# Patient Record
Sex: Male | Born: 2005 | Race: Black or African American | Hispanic: No | Marital: Single | Smoking: Never smoker
Health system: Southern US, Community
[De-identification: ages and names within clinical notes are randomized; demographics above are authoritative.]

## PROBLEM LIST (undated history)

## (undated) DIAGNOSIS — F419 Anxiety disorder, unspecified: Secondary | ICD-10-CM

## (undated) DIAGNOSIS — F319 Bipolar disorder, unspecified: Secondary | ICD-10-CM

---
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
## 2023-09-21 ENCOUNTER — Emergency Department
Admission: EM | Admit: 2023-09-21 | Discharge: 2023-09-21 | Disposition: A | Discharge status: Home / Self Care | Attending: Emergency Medicine | Admitting: Emergency Medicine

## 2023-09-21 ENCOUNTER — Other Ambulatory Visit: Payer: Self-pay

## 2023-09-21 DIAGNOSIS — R42 Dizziness and giddiness: Secondary | ICD-10-CM | POA: Diagnosis present

## 2023-09-21 HISTORY — DX: Anxiety disorder, unspecified: F41.9

## 2023-09-21 HISTORY — DX: Bipolar disorder, unspecified: F31.9

## 2023-09-21 LAB — BASIC METABOLIC PANEL WITH GFR
Anion gap: 8 (ref 5–15)
BUN: 13 mg/dL (ref 6–20)
CO2: 26 mmol/L (ref 22–32)
Calcium: 9.2 mg/dL (ref 8.9–10.3)
Chloride: 104 mmol/L (ref 98–111)
Creatinine, Ser: 0.87 mg/dL (ref 0.61–1.24)
GFR, Estimated: >60 mL/min (ref >60)
Glucose, Bld: 96 mg/dL (ref 70–99)
Potassium: 4 mmol/L (ref 3.5–5.1)
Sodium: 138 mmol/L (ref 135–145)

## 2023-09-21 LAB — CBC WITH DIFFERENTIAL/PLATELET
Abs Immature Granulocytes: 0.02 10*3/uL (ref 0.00–0.07)
Basophils Absolute: 0 10*3/uL (ref 0.0–0.1)
Basophils Relative: 0 %
Eosinophils Absolute: 0.1 10*3/uL (ref 0.0–0.5)
Eosinophils Relative: 1 %
HCT: 38.9 % — ABNORMAL LOW (ref 39.0–52.0)
Hemoglobin: 12.5 g/dL — ABNORMAL LOW (ref 13.0–17.0)
Immature Granulocytes: 0 %
Lymphocytes Relative: 46 %
Lymphs Abs: 3.2 10*3/uL (ref 0.7–4.0)
MCH: 27.1 pg (ref 26.0–34.0)
MCHC: 32.1 g/dL (ref 30.0–36.0)
MCV: 84.4 fL (ref 80.0–100.0)
Monocytes Absolute: 0.7 10*3/uL (ref 0.1–1.0)
Monocytes Relative: 9 %
Neutro Abs: 3.2 10*3/uL (ref 1.7–7.7)
Neutrophils Relative %: 44 %
Platelets: 234 10*3/uL (ref 150–400)
RBC: 4.61 MIL/uL (ref 4.22–5.81)
RDW: 12.9 % (ref 11.5–15.5)
WBC: 7.3 10*3/uL (ref 4.0–10.5)
nRBC: 0 % (ref 0.0–0.2)

## 2023-09-21 LAB — BRAIN NATRIURETIC PEPTIDE: B Natriuretic Peptide: 5 pg/mL (ref 0.0–100.0)

## 2023-09-21 NOTE — ED Provider Notes (Signed)
 [facility] Provider Note    Event Date/Time   First MD Initiated Contact with Patient 09/21/23 1052     (approximate)   History   Medication Reaction   HPI  Troy Clay is a 18 y.o. male who presents today for evaluation of an episode of dizziness that occurred yesterday.  Patient reports that when he woke up in the morning he felt lightheaded like he was going to pass out.  He reports that this has happened to him in the past, though not recently.  He reports that it happens to him when he is in the heat.  He notes that the fans were not working in the house last night.  His family member reports that she took his blood pressure and noted it was low.  Patient reports that he feels completely normal and at his baseline now.  Patient did not have any chest pain, headache, shortness breath, abdominal pain, nausea, diaphoresis, vomiting, or any other symptoms.  His family member is wondering if this is due to his Abilify which he has been on for several weeks.  There are no active problems to display for this patient.         Physical Exam   Triage Vital Signs: ED Triage Vitals [09/21/23 1026]  Encounter Vitals Group     BP 130/87     Girls Systolic BP Percentile      Girls Diastolic BP Percentile      Boys Systolic BP Percentile      Boys Diastolic BP Percentile      Pulse Rate 85     Resp 18     Temp 98.4 F (36.9 C)     Temp src      SpO2 100 %     Weight 212 lb (96.2 kg)     Height 5' 9 (1.753 m)     Head Circumference      Peak Flow      Pain Score 0     Pain Loc      Pain Education      Exclude from Growth Chart     Most recent vital signs: Vitals:   09/21/23 1026 09/21/23 1044  BP: 130/87   Pulse: 85   Resp: 18   Temp: 98.4 F (36.9 C)   SpO2: 100% 100%    Physical Exam Vitals and nursing note reviewed.  Constitutional:      General: Awake and alert. No acute distress.    Appearance: Normal appearance. The patient is  normal weight.  HENT:     Head: Normocephalic and atraumatic.     Mouth: Mucous membranes are moist.  Eyes:     General: PERRL. Normal EOMs        Right eye: No discharge.        Left eye: No discharge.     Conjunctiva/sclera: Conjunctivae normal.  Cardiovascular:     Rate and Rhythm: Normal rate and regular rhythm.     Pulses: Normal pulses.  Pulmonary:     Effort: Pulmonary effort is normal. No respiratory distress.     Breath sounds: Normal breath sounds.  Abdominal:     Abdomen is soft. There is no abdominal tenderness. No rebound or guarding. No distention. Musculoskeletal:        General: No swelling. Normal range of motion.     Cervical back: Normal range of motion and neck supple.  Skin:    General: Skin is warm and  dry.     Capillary Refill: Capillary refill takes less than 2 seconds.     Findings: No rash.  Neurological:     Mental Status: The patient is awake and alert.      ED Results / Procedures / Treatments   Labs (all labs ordered are listed, but only abnormal results are displayed) Labs Reviewed  CBC WITH DIFFERENTIAL/PLATELET - Abnormal; Notable for the following components:      Result Value   Hemoglobin 12.5 (*)    HCT 38.9 (*)    All other components within normal limits  BRAIN NATRIURETIC PEPTIDE  BASIC METABOLIC PANEL WITH GFR     EKG     RADIOLOGY     PROCEDURES:  Critical Care performed:   Procedures   MEDICATIONS ORDERED IN ED: Medications - No data to display   IMPRESSION / MDM / ASSESSMENT AND PLAN / ED COURSE  I reviewed the triage vital signs and the nursing notes.   Differential diagnosis includes, but is not limited to, medication reaction, dehydration, electrolyte disarray, arrhythmia.  Patient is awake and alert, hemodynamically stable and afebrile.  He has a normal blood pressure of 130/87.  He is asymptomatic.  EKG obtained is nonischemic, has sinus arrhythmia.  Also reviewed by attending MD.  Labs obtained  for evaluation of dehydration, electrolyte disarray, or symptomatic anemia.  Labs are overall reassuring.  Patient has not had any recurrence of his symptoms.  His reported episode of hypotension is not clearly due to the Abilify. He has follow-up appointment with [facility] today, I recommend that he keep this appointment to discuss the Abilify.  We discussed symptomatic management and return precautions.  Patient and feel member understand and agree with plan.  He was discharged in stable condition.   Patient's presentation is most consistent with acute complicated illness / injury requiring diagnostic workup.   Clinical Course as of 09/21/23 1438  Thu Sep 21, 2023  1129 BNP ordered in error, BMP ordered instead [JP]    Clinical Course User Index [JP] Ladaija Dimino E, PA-C     FINAL CLINICAL IMPRESSION(S) / ED DIAGNOSES   Final diagnoses:  Lightheadedness     Rx / DC Orders   ED Discharge Orders     None        Note:  This document was prepared using Dragon voice recognition software and may include unintentional dictation errors.   Domenique Southers E, PA-C 09/21/23 1439    Levander Slate, MD 09/21/23 [phone]

## 2023-09-21 NOTE — ED Triage Notes (Signed)
 Pt comes with c/o dizziness since yesterday. Mom states pt woke up at 230 yesterday and was dizzy. Mom reports she checked his BP and it was low in the 80s. Pt was recently started on abilify

## 2023-09-21 NOTE — Discharge Instructions (Signed)
 Your EKG and blood work were normal.  Please follow-up with your outpatient provider.  Please return for any new, worsening, or change in symptoms or other concerns.
